# Patient Record
Sex: Male | Born: 1960 | Race: White | Hispanic: No | Smoking: Never smoker
Health system: Southern US, Community
[De-identification: ages and names within clinical notes are randomized; demographics above are authoritative.]

## PROBLEM LIST (undated history)

## (undated) DIAGNOSIS — R7303 Prediabetes: Secondary | ICD-10-CM

## (undated) DIAGNOSIS — E782 Mixed hyperlipidemia: Secondary | ICD-10-CM

## (undated) DIAGNOSIS — I10 Essential (primary) hypertension: Secondary | ICD-10-CM

## (undated) DIAGNOSIS — R002 Palpitations: Secondary | ICD-10-CM

## (undated) DIAGNOSIS — Z01810 Encounter for preprocedural cardiovascular examination: Secondary | ICD-10-CM

## (undated) HISTORY — PX: EYE SURGERY: SHX253

## (undated) HISTORY — DX: Essential (primary) hypertension: I10

## (undated) HISTORY — DX: Mixed hyperlipidemia: E78.2

## (undated) HISTORY — DX: Prediabetes: R73.03

## (undated) HISTORY — PX: TYMPANOSTOMY TUBE PLACEMENT: SHX32

## (undated) HISTORY — DX: Palpitations: R00.2

## (undated) HISTORY — DX: Encounter for preprocedural cardiovascular examination: Z01.810

---
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
## 2012-06-06 ENCOUNTER — Ambulatory Visit: Payer: Self-pay | Admitting: Emergency Medicine

## 2012-06-06 VITALS — BP 115/80 | HR 76 | Temp 97.5°F | Resp 18 | Ht 64.5 in | Wt 144.0 lb

## 2012-06-06 DIAGNOSIS — Z0289 Encounter for other administrative examinations: Secondary | ICD-10-CM

## 2012-08-11 NOTE — Progress Notes (Signed)
  Subjective:    Patient ID: Tyler Santana, male    DOB: 07-29-1961, 51 y.o.   MRN: [id number]  HPI  DOT exam  Review of Systems    As per HPI, otherwise negative.   Objective:   Physical Exam  GEN: WDWN, NAD, Non-toxic, A & O x 3 HEENT: Atraumatic, Normocephalic. Neck supple. No masses, No LAD. Ears and Nose: No external deformity. CV: RRR, No M/G/R. No JVD. No thrill. No extra heart sounds. PULM: CTA B, no wheezes, crackles, rhonchi. No retractions. No resp. distress. No accessory muscle use. ABD: S, NT, ND, +BS. No rebound. No HSM. EXTR: No c/c/e NEURO Normal gait.  PSYCH: Normally interactive. Conversant. Not depressed or anxious appearing.  Calm demeanor.        Assessment & Plan:  DOT exam completed

## 2012-12-19 ENCOUNTER — Ambulatory Visit: Payer: Self-pay | Admitting: Family Medicine

## 2012-12-19 ENCOUNTER — Ambulatory Visit: Payer: Self-pay

## 2012-12-19 VITALS — BP 118/82 | HR 80 | Temp 98.3°F | Resp 16 | Ht 63.5 in | Wt 145.8 lb

## 2012-12-19 DIAGNOSIS — S8992XA Unspecified injury of left lower leg, initial encounter: Secondary | ICD-10-CM

## 2012-12-19 DIAGNOSIS — S99929A Unspecified injury of unspecified foot, initial encounter: Secondary | ICD-10-CM

## 2012-12-19 DIAGNOSIS — S59919A Unspecified injury of unspecified forearm, initial encounter: Secondary | ICD-10-CM

## 2012-12-19 DIAGNOSIS — S6992XA Unspecified injury of left wrist, hand and finger(s), initial encounter: Secondary | ICD-10-CM

## 2012-12-19 DIAGNOSIS — S59909A Unspecified injury of unspecified elbow, initial encounter: Secondary | ICD-10-CM

## 2012-12-19 MED ORDER — OXYCODONE-ACETAMINOPHEN 5-325 MG PO TABS: 1.0000 | ORAL_TABLET | ORAL | Status: DC | PRN

## 2012-12-19 NOTE — Progress Notes (Signed)
Subjective:    Patient ID: Tyler Santana, male    DOB: 1961/01/28, 52 y.o.   MRN: [id number] Chief Complaint  Patient presents with  . Knee Pain    both knees but L knee is worse  . Arm Pain    L arm pain  . Wrist Pain    L wrist  . Fall    fell yesterday while bowling and fell again last nigt down stairs    HPI  He feel forward onto his arms and knees at the bowling alley - was very slick and fell hard - the whole boweling alley heard him. Then this morning he just tripped and fell down the stairs.  Did hit his head a little yesterday after the stair fall but just a bump - didn't hurt it, no headache.  He is very tired more than normal but didn't sleep well last night due to pain.  Main pain is in left knee and left wrist Did take some tylenol about 4-6 hours ago - not touching the pain.  Does have a distant h/o several ortho injuries - patellar fracture, left radial/ulnar fracture - he never followed up with ortho after healing as he was instructed. 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   History reviewed. No pertinent past medical history. No current outpatient prescriptions on file prior to visit.   No current facility-administered medications on file prior to visit.   Allergies  Allergen Reactions  . Codeine Rash     Review of Systems  Constitutional: Negative for fever, chills, diaphoresis, activity change and appetite change.  Musculoskeletal: Positive for myalgias, back pain, joint swelling, arthralgias and gait problem.  Skin: Negative for color change, rash and wound.  Neurological: Positive for weakness and headaches. Negative for dizziness, tremors, seizures, syncope, light-headedness and numbness.  Hematological: Negative for adenopathy. Does not bruise/bleed easily.  Psychiatric/Behavioral: Positive for sleep disturbance.      BP 118/82  Pulse 80  Temp(Src) 98.3 F (36.8 C) (Oral)  Resp 16  Ht 5' 3.5" (1.613 m)  Wt 145 lb 12.8 oz  (66.134 kg)  BMI 25.42 kg/m2  SpO2 97% Objective:   Physical Exam  Constitutional: He appears well-nourished. No distress.  HENT:  Head: Normocephalic and atraumatic.  Eyes: Conjunctivae are normal. Pupils are equal, round, and reactive to light. No scleral icterus.  Neck: Normal range of motion. Neck supple. No thyromegaly present.  Cardiovascular: Normal rate, regular rhythm, normal heart sounds and intact distal pulses.   Pulmonary/Chest: Effort normal and breath sounds normal. No respiratory distress.  Musculoskeletal: He exhibits no edema.       Left wrist: He exhibits decreased range of motion, tenderness and bony tenderness. He exhibits no swelling, no effusion, no crepitus, no deformity and no laceration.       Left knee: He exhibits decreased range of motion and bony tenderness. He exhibits no swelling, no effusion, no ecchymosis, no deformity, no laceration, no erythema and normal patellar mobility. Tenderness found. Medial joint line, MCL and patellar tendon tenderness noted.  Left knee with most tenderness over medial tibial plateau  Lymphadenopathy:    He has no cervical adenopathy.  Neurological: He is alert. He has normal reflexes. He is not disoriented. No cranial nerve deficit.  Skin: Skin is warm and dry. He is not diaphoretic.  Psychiatric: He has a normal mood and affect. His behavior is normal.         [facility] reading (PRIMARY) by  Dr. Clelia Croft. Left wrist: Abnormality of the ulnar styloid?  Possible chronic? - Stat overread - No acute finding. 2. Findings consistent with ulnocarpal abutment.  3. Degenerative disease distal radioulnar joint. Left knee - some mild medial joint space narrowing. No acute bony abnormality seen. Assessment & Plan:  Wrist injury, left, initial encounter - Plan: DG Wrist Complete Left, oxyCODONE-acetaminophen (ROXICET) 5-325 MG per tablet. Suspect acute flair of chronic arthritis and joint degeneration - placed in pre-fab cock-up wrist splint and  sling. Out of work x 4d, then ok to gradually return to normal acitivity. If pain cont or worsens, RTC for further eval - if wrist pain continues - will likely need ortho referral due to the chronic ulnocarpal degeneration and compression.  Knee injuries, left, initial encounter - Plan: DG Knee Complete 4 Views Left.  Suspect injury caused flair of OA vs pes anserine burisitis. If he cont to have pain after sev days of rest, rec RTC to consider steroid injection  Meds ordered this encounter  Medications  . oxyCODONE-acetaminophen (ROXICET) 5-325 MG per tablet    Sig: Take 1 tablet by mouth every 4 (four) hours as needed for pain.    Dispense:  40 tablet    Refill:  0

## 2012-12-19 NOTE — Patient Instructions (Addendum)
Wrist Sprain °with Rehab °A sprain is an injury in which a ligament that maintains the proper alignment of a joint is partially or completely torn. The ligaments of the wrist are susceptible to sprains. Sprains are classified into three categories. Grade 1 sprains cause pain, but the tendon is not lengthened. Grade 2 sprains include a lengthened ligament because the ligament is stretched or partially ruptured. With grade 2 sprains there is still function, although the function may be diminished. Grade 3 sprains are characterized by a complete tear of the tendon or muscle, and function is usually impaired. °SYMPTOMS  °· Pain tenderness, inflammation, and/or bruising (contusion) of the injury. °· A "pop" or tear felt and/or heard at the time of injury. °· Decreased wrist function. °CAUSES  °A wrist sprain occurs when a force is placed on one or more ligaments that is greater than it/they can withstand. Common mechanisms of injury include: °· Catching a ball with you hands. °· Repetitive and/ or strenuous extension or flexion of the wrist. °RISK INCREASES WITH: °· Previous wrist injury. °· Contact sports (boxing or wrestling). °· Activities in which falling is common. °· Poor strength and flexibility. °· Improperly fitted or padded protective equipment. °PREVENTION °· Warm up and stretch properly before activity. °· Allow for adequate recovery between workouts. °· Maintain physical fitness: °· Strength, flexibility, and endurance. °· Cardiovascular fitness. °· Protect the wrist joint by limiting its motion with the use of taping, braces, or splints. °· Protect the wrist after injury for 6 to 12 months. °PROGNOSIS  °The prognosis for wrist sprains depends on the degree of injury. Grade 1 sprains require 2 to 6 weeks of treatment. Grade 2 sprains require 6 to 8 weeks of treatment, and grade 3 sprains require up to 12 weeks.  °RELATED COMPLICATIONS  °· Prolonged healing time, if improperly treated or  re-injured. °· Recurrent symptoms that result in a chronic problem. °· Injury to nearby structures (bone, cartilage, nerves, or tendons). °· Arthritis of the wrist. °· Inability to compete in athletics at a high level. °· Wrist stiffness or weakness. °· Progression to a complete rupture of the ligament. °TREATMENT  °Treatment initially involves resting from any activities that aggravate the symptoms, and the use of ice and medications to help reduce pain and inflammation. Your caregiver may recommend immobilizing the wrist for a period of time in order to reduce stress on the ligament and allow for healing. After immobilization it is important to perform strengthening and stretching exercises to help regain strength and a full range of motion. These exercises may be completed at home or with a therapist. Surgery is not usually required for wrist sprains, unless the ligament has been ruptured (grade 3 sprain). °MEDICATION  °· If pain medication is necessary, then nonsteroidal anti-inflammatory medications, such as aspirin and ibuprofen, or other minor pain relievers, such as acetaminophen, are often recommended. °· Do not take pain medication for 7 days before surgery. °· Prescription pain relievers may be given if deemed necessary by your caregiver. Use only as directed and only as much as you need. °HEAT AND COLD °· Cold treatment (icing) relieves pain and reduces inflammation. Cold treatment should be applied for 10 to 15 minutes every 2 to 3 hours for inflammation and pain and immediately after any activity that aggravates your symptoms. Use ice packs or massage the area with a piece of ice (ice massage). °· Heat treatment may be used prior to performing the stretching and strengthening activities prescribed by your   caregiver, physical therapist, or athletic trainer. Use a heat pack or soak your injury in warm water. °SEEK MEDICAL CARE IF: °· Treatment seems to offer no benefit, or the condition worsens. °· Any  medications produce adverse side effects. °EXERCISES °RANGE OF MOTION (ROM) AND STRETCHING EXERCISES - Wrist Sprain  °These exercises may help you when beginning to rehabilitate your injury. Your symptoms may resolve with or without further involvement from your physician, physical therapist or athletic trainer. While completing these exercises, remember:  °· Restoring tissue flexibility helps normal motion to return to the joints. This allows healthier, less painful movement and activity. °· An effective stretch should be held for at least 30 seconds. °· A stretch should never be painful. You should only feel a gentle lengthening or release in the stretched tissue. °RANGE OF MOTION  Wrist Flexion, Active-Assisted °· Extend your right / left elbow with your fingers pointing down.* °· Gently pull the back of your hand towards you until you feel a gentle stretch on the top of your forearm. °· Hold this position for __________ seconds. °Repeat __________ times. Complete this exercise __________ times per day.  °*If directed by your physician, physical therapist or athletic trainer, complete this stretch with your elbow bent rather than extended. °RANGE OF MOTION  Wrist Extension, Active-Assisted °· Extend your right / left elbow and turn your palm upwards.* °· Gently pull your palm/fingertips back so your wrist extends and your fingers point more toward the ground. °· You should feel a gentle stretch on the inside of your forearm. °· Hold this position for __________ seconds. °Repeat __________ times. Complete this exercise __________ times per day. °*If directed by your physician, physical therapist or athletic trainer, complete this stretch with your elbow bent, rather than extended. °RANGE OF MOTION  Supination, Active °· Stand or sit with your elbows at your side. Bend your right / left elbow to 90 degrees. °· Turn your palm upward until you feel a gentle stretch on the inside of your forearm. °· Hold this position  for __________ seconds. Slowly release and return to the starting position. °Repeat __________ times. Complete this stretch __________ times per day.  °RANGE OF MOTION  Pronation, Active °· Stand or sit with your elbows at your side. Bend your right / left elbow to 90 degrees. °· Turn your palm downward until you feel a gentle stretch on the top of your forearm. °· Hold this position for __________ seconds. Slowly release and return to the starting position. °Repeat __________ times. Complete this stretch __________ times per day.  °STRETCH - Wrist Flexion °· Place the back of your right / left hand on a tabletop leaving your elbow slightly bent. Your fingers should point away from your body. °· Gently press the back of your hand down onto the table by straightening your elbow. You should feel a stretch on the top of your forearm. °· Hold this position for __________ seconds. °Repeat __________ times. Complete this stretch __________ times per day.  °STRETCH  Wrist Extension °· Place your right / left fingertips on a tabletop leaving your elbow slightly bent. Your fingers should point backwards. °· Gently press your fingers and palm down onto the table by straightening your elbow. You should feel a stretch on the inside of your forearm. °· Hold this position for __________ seconds. °Repeat __________ times. Complete this stretch __________ times per day.  °STRENGTHENING EXERCISES - Wrist Sprain °These exercises may help you when beginning to rehabilitate your injury.   They may resolve your symptoms with or without further involvement from your physician, physical therapist or athletic trainer. While completing these exercises, remember:  °· Muscles can gain both the endurance and the strength needed for everyday activities through controlled exercises. °· Complete these exercises as instructed by your physician, physical therapist or athletic trainer. Progress with the resistance and repetition exercises only as your  caregiver advises. °STRENGTH Wrist Flexors °· Sit with your right / left forearm palm-up and fully supported. Your elbow should be resting below the height of your shoulder. Allow your wrist to extend over the edge of the surface. °· Loosely holding a __________ weight or a piece of rubber exercise band/tubing, slowly curl your hand up toward your forearm. °· Hold this position for __________ seconds. Slowly lower the wrist back to the starting position in a controlled manner. °Repeat __________ times. Complete this exercise __________ times per day.  °STRENGTH  Wrist Extensors °· Sit with your right / left forearm palm-down and fully supported. Your elbow should be resting below the height of your shoulder. Allow your wrist to extend over the edge of the surface. °· Loosely holding a __________ weight or a piece of rubber exercise band/tubing, slowly curl your hand up toward your forearm. °· Hold this position for __________ seconds. Slowly lower the wrist back to the starting position in a controlled manner. °Repeat __________ times. Complete this exercise __________ times per day.  °STRENGTH - Ulnar Deviators °· Stand with a ____________________ weight in your right / left hand, or sit holding on to the rubber exercise band/tubing with your opposite arm supported. °· Move your wrist so that your pinkie travels toward your forearm and your thumb moves away from your forearm. °· Hold this position for __________ seconds and then slowly lower the wrist back to the starting position. °Repeat __________ times. Complete this exercise __________ times per day °STRENGTH - Radial Deviators °· Stand with a ____________________ weight in your °· right / left hand, or sit holding on to the rubber exercise band/tubing with your arm supported. °· Raise your hand upward in front of you or pull up on the rubber tubing. °· Hold this position for __________ seconds and then slowly lower the wrist back to the starting  position. °Repeat __________ times. Complete this exercise __________ times per day. °STRENGTH  Forearm Supinators °· Sit with your right / left forearm supported on a table, keeping your elbow below shoulder height. Rest your hand over the edge, palm down. °· Gently grip a hammer or a soup ladle. °· Without moving your elbow, slowly turn your palm and hand upward to a "thumbs-up" position. °· Hold this position for __________ seconds. Slowly return to the starting position. °Repeat __________ times. Complete this exercise __________ times per day.  °STRENGTH  Forearm Pronators °· Sit with your right / left forearm supported on a table, keeping your elbow below shoulder height. Rest your hand over the edge, palm up. °· Gently grip a hammer or a soup ladle. °· Without moving your elbow, slowly turn your palm and hand upward to a "thumbs-up" position. °· Hold this position for __________ seconds. Slowly return to the starting position. °Repeat __________ times. Complete this exercise __________ times per day.  °STRENGTH - Grip °· Grasp a tennis ball, a dense sponge, or a large, rolled sock in your hand. °· Squeeze as hard as you can without increasing any pain. °· Hold this position for __________ seconds. Release your grip slowly. °Repeat   __________ times. Complete this exercise __________ times per day.  Document Released: 10/05/2005 Document Revised: 12/28/2011 Document Reviewed: 01/17/2009 [facility] Patient Information 2013 [place], [place].   Pes Anserinus Syndrome with Rehab The pes anserine, also known as the goose's foot, is an area of the shinbone (tibia) near the knee joint where the tendons of three of the muscles of the thigh insert into the bone. These muscles are important for bending the knee and bringing the leg across the body. Just underneath the three tendons that attach at the pes anserinus exists a fluid filled sac (bursa) that is meant to reduce the friction between the tendons and the  tibia. Pes anserinus syndrome is a condition that is characterized by inflammation of the bursa (bursitis) and/ or tendonitis (inflammation of the tendon) and may cause severe pain in the lower portion of the inner (medial) side of the knee. SYMPTOMS   Pain and inflammation over the lower portion of the medial side of the knee.  Pain that worsens as the duration of an activity increases.  Pain that worsens when bending the knee, especially against resistance.  A crackling sound (crepitation) when the tendon or bursa is moved or touched. CAUSES  Bursitis and tendonitis are usually characterized as overuse injuries. Common mechanisms of injury include:  Stress placed on the knee from a sudden increase in the intensity, frequency, or duration of training.  Direct trauma to the upper leg (less common). RISK INCREASES WITH:  Endurance sports (distance running or triathletes).  Making changes to or beginning a new training program.  Sports that place stress on the muscles that insert at the pes anserinus, such as those that require pivoting, cutting, or jumping.  Improper training.  Poor strength and flexibility  Failure to warm-up properly before activity.  Improper knee alignment ( knock knees).  Arthritis of the knee. PREVENTION  Warm up and stretch properly before activity.  Allow for adequate recovery between workouts.  Maintain physical fitness:  Strength, flexibility, and endurance.  Cardiovascular fitness.  Learn and use training methods that will reduce the stress placed on the pes anserinus.  Arch supports (orthotics) may be helpful for those with flat feet. PROGNOSIS  If treated properly, then the symptoms of pes anserinus syndrome usually resolve within 6 weeks.  RELATED COMPLICATIONS   Persistent and potentially chronic pain if the condition is not treated properly.  Re-injury if activity is resumed before the injury is allowed to heal completely, or if  one resumes improper training habits. TREATMENT Treatment initially involves the use of ice and medication to help reduce pain and inflammation. The use of strengthening and stretching exercises may help reduce pain with activity. These exercises may be performed at home or with a therapist. Individuals who have flat feet may find benefit in wearing arch supports in their shoes. Some individuals find that compression bandages or knee sleeves help reduce symptoms. Your caregiver may recommend a corticosteroid injection to help reduce inflammation. If symptoms persist, despite conservative treatment for greater than 6 months, then surgery may be recommended.  MEDICATION   If pain medication is necessary, then nonsteroidal anti-inflammatory medications, such as aspirin and ibuprofen, or other minor pain relievers, such as acetaminophen, are often recommended.  Do not take pain medication for 7 days before surgery.  Prescription pain relievers may be given if deemed necessary by your caregiver. Use only as directed and only as much as you need.  Corticosteroid injections may be given by your caregiver. These injections should  be reserved for the most serious cases, because they may only be given a certain number of times. SEEK MEDICAL CARE IF:  Treatment seems to offer no benefit, or the condition worsens.  Any medications produce adverse side effects. EXERCISES  RANGE OF MOTION (ROM) AND STRETCHING EXERCISES - Pes Anserinus Syndrome These exercises may help you when beginning to rehabilitate your injury. Your symptoms may resolve with or without further involvement from your physician, physical therapist or athletic trainer. While completing these exercises, remember:   Restoring tissue flexibility helps normal motion to return to the joints. This allows healthier, less painful movement and activity.  An effective stretch should be held for at least 30 seconds.  A stretch should never be  painful. You should only feel a gentle lengthening or release in the stretched tissue. STRETCH  Hamstrings, Supine  Lie on your back. Loop a belt or towel over the ball of your right / left foot.  Straighten your right / left knee and slowly pull on the belt to raise your leg. Do not allow the right / left knee to bend. Keep your opposite leg flat on the floor.  Raise the leg until you feel a gentle stretch behind your right / left knee or thigh. Hold this position for __________ seconds. Repeat __________ times. Complete this stretch __________ times per day.  STRETCH - Hamstrings, Doorway  Lie on your back with your right / left leg extended and resting on the wall and the opposite leg flat on the ground through the door. Initially, position your bottom farther away from the wall than the illustration shows.  Keep your right / left knee straight. If you feel a stretch behind your knee or thigh, hold this position for __________ seconds.  If you do not feel a stretch, scoot your bottom closer to the door, and hold __________ seconds. Repeat __________ times. Complete this stretch __________ times per day.  STRETCH - Hamstrings/Adductors, V-Sit  Sit on the floor with your legs extended in a large "V," keeping your knees straight.  With your head and chest upright, bend at your waist reaching for your right foot to stretch your left adductors.  You should feel a stretch in your left inner thigh. Hold for __________ seconds.  Return to the upright position to relax your leg muscles.  Continuing to keep your chest upright, bend straight forward at your waist to stretch your hamstrings.  You should feel a stretch behind both of your thighs and/or knees. Hold for __________ seconds.  Return to the upright position to relax your leg muscles.  Repeat steps 2 through 4. Repeat __________ times. Complete this exercise __________ times per day.  STRETCH - Hamstrings, Standing  Stand or sit  and extend your right / left leg, placing your foot on a chair or foot stool  Keeping a slight arch in your low back and your hips straight forward.  Lead with your chest and lean forward at the waist until you feel a gentle stretch in the back of your right / left knee or thigh. (When done correctly, this exercise requires leaning only a small distance.)  Hold this position for __________ seconds. Repeat __________ times. Complete this stretch __________ times per day. STRETCH - Adductors, Lunge  While standing, spread your legs  Lean away from your right / left leg by bending your opposite knee. You may rest your hands on your thigh for balance.  You should feel a stretch in your right /  left inner thigh. Hold for __________ seconds. Repeat __________ times. Complete this exercise __________ times per day.  STRETCH - Adductors, Standing  Place your right / left foot on a counter or stable table. Turn away from your leg so both hips line up with your right / left leg.  Keeping your hips facing forward, slowly bend your opposite leg until you feel a gentle stretch on the inside of your right / left thigh.  Hold for __________ seconds. Repeat __________ times. Complete this exercise __________ times per day.  STRENGTHENING EXERCISES - Pes Anserinus Syndrome  These exercises may help you when beginning to rehabilitate your injury. They may resolve your symptoms with or without further involvement from your physician, physical therapist or athletic trainer. While completing these exercises, remember:   Muscles can gain both the endurance and the strength needed for everyday activities through controlled exercises.  Complete these exercises as instructed by your physician, physical therapist or athletic trainer. Progress the resistance and repetitions only as guided. STRENGTH - Hamstring, Curls  Lay on your stomach with your legs extended. (If you lay on a bed, your feet may hang over the  edge.)  Tighten the muscles in the back of your thigh to bend your right / left knee up to 90 degrees. Keep your hips flat on the bed/floor.  Hold this position for __________ seconds.  Slowly lower your leg back to the starting position. Repeat __________ times. Complete this exercise __________ times per day.  OPTIONAL ANKLE WEIGHTS: Begin with ____________________, but DO NOT exceed ____________________. Increase in 1 lb/0.5 kg increments.  STRENGTH - Hip Adductors, Straight Leg Raises  Lie on your side so that your head, shoulders, knee and hip line up. You may place your upper foot in front to help maintain your balance. Your right / left leg should be on the bottom.  Roll your hips slightly forward, so that your hips are stacked directly over each other and your right / left knee is facing forward.  Tense the muscles in your inner thigh and lift your bottom leg 4-6 inches. Hold this position for __________ seconds.  Slowly lower your leg to the starting position. Allow the muscles to fully relax before beginning the next repetition. Repeat __________ times. Complete this exercise __________ times per day.  Document Released: 10/05/2005 Document Revised: 12/28/2011 Document Reviewed: 01/17/2009 [facility] Patient Information 2013 [place], [place].

## 2013-04-07 ENCOUNTER — Telehealth: Payer: Self-pay

## 2013-04-07 NOTE — Telephone Encounter (Signed)
Pt would like a copy of his [employer] long form which was done 06/06/2012, pt states he needs this asap. Fax to: [phone] Best# [phone]

## 2013-04-07 NOTE — Telephone Encounter (Signed)
Received documentation with confirmation and stapled to paper chart.

## 2013-04-07 NOTE — Telephone Encounter (Signed)
Faxed to that number. Just waiting on a confirmation from the fax machine.

## 2013-04-12 ENCOUNTER — Telehealth: Payer: Self-pay

## 2013-04-12 NOTE — Telephone Encounter (Signed)
Patient would like for his [employer] long form to be faxed to [phone].  He will be leaving the office at 3 today and needs to have it faxed by then if possible.  The patient's chart shows that the form was faxed on June 20.  I verified the fax number, and the fax number listed in his chart was different than what he gave me today.

## 2013-04-13 NOTE — Telephone Encounter (Signed)
In patient's paper chart someone has sent it to that fax number with a confirmation yesterday.

## 2013-12-08 IMAGING — CR DG WRIST COMPLETE 3+V*L*
2 series · 2 of 2 positions shown · non-contrast
Comparison: None.

CLINICAL DATA: Fall, pain.

LEFT WRIST - COMPLETE 3+ VIEW

[PA]
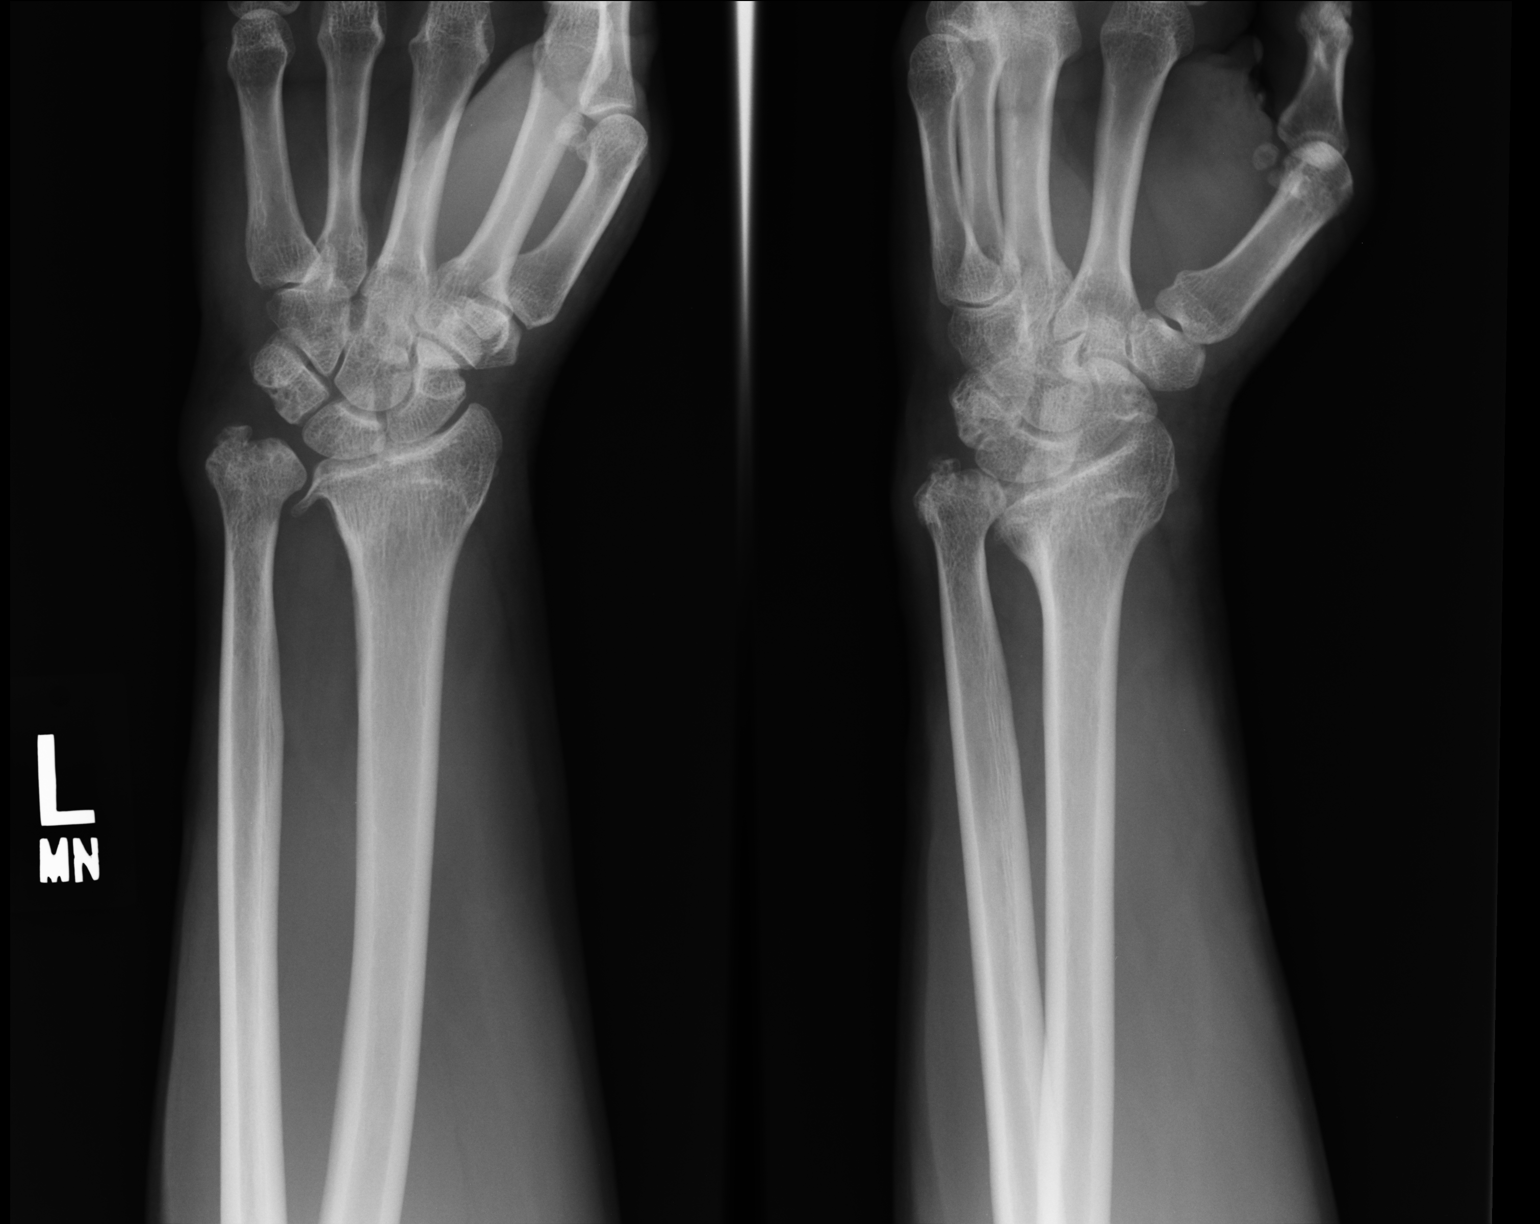

[lateral]
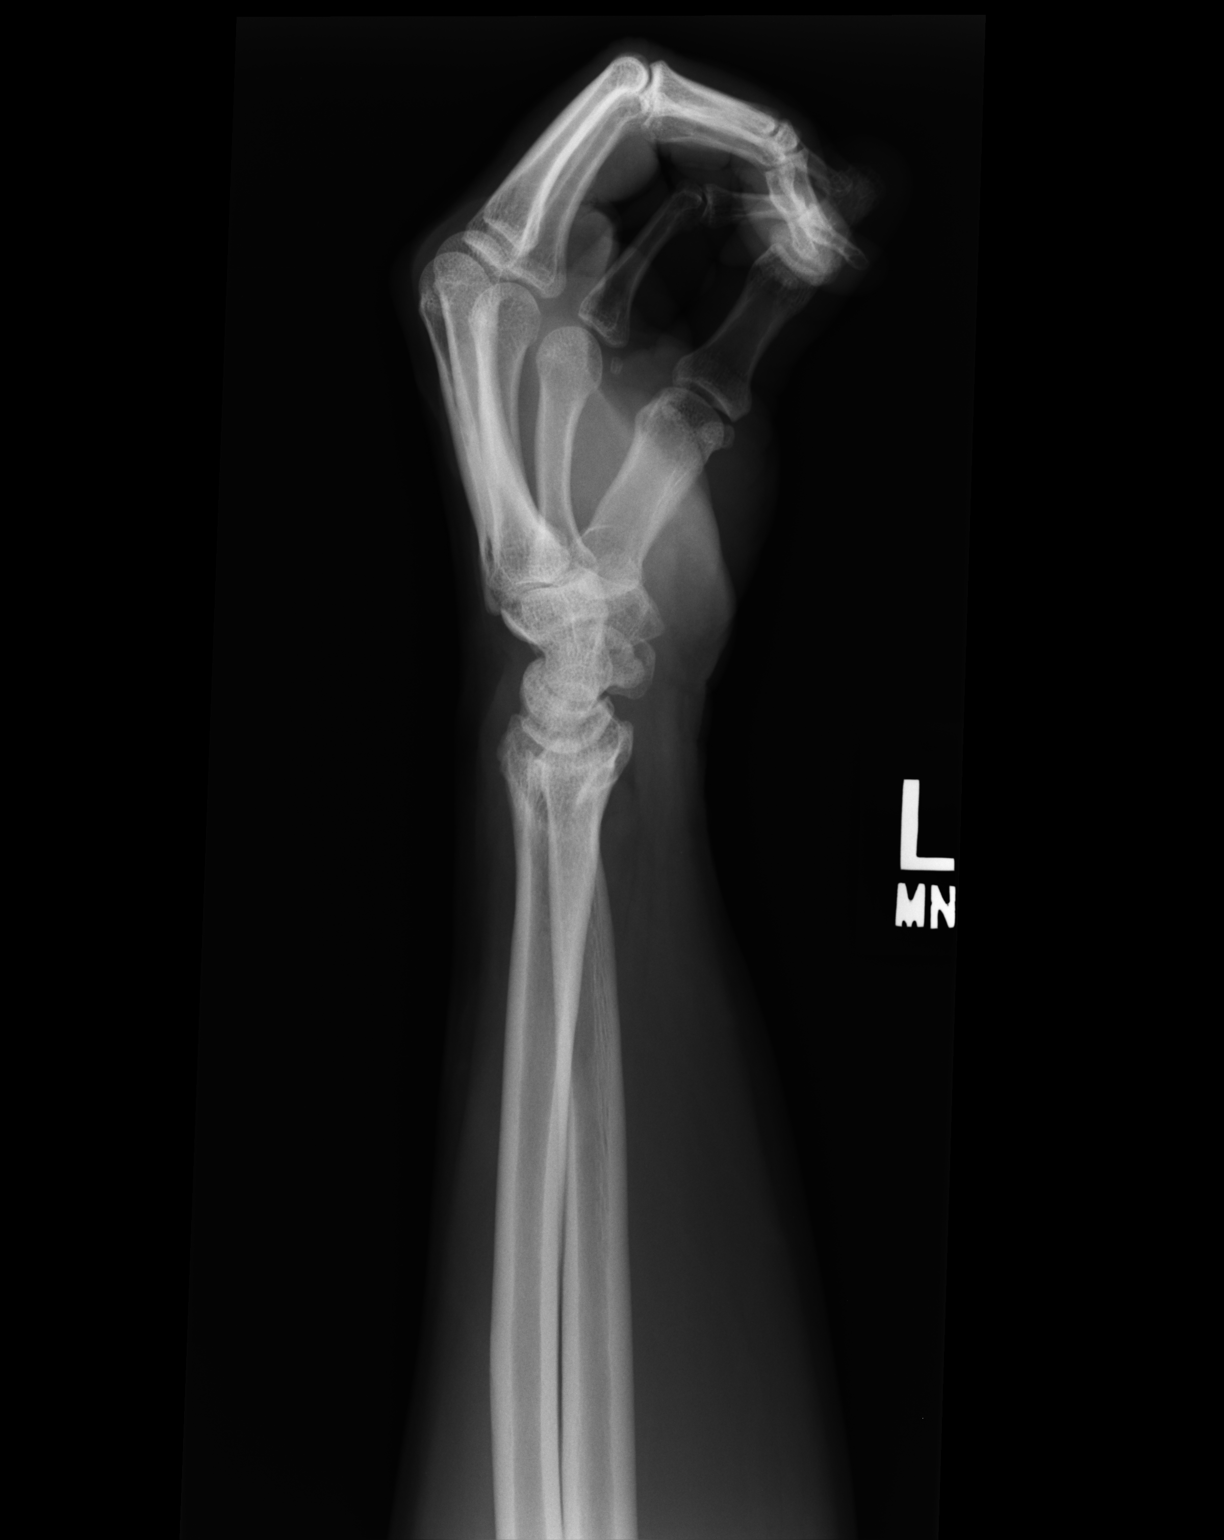

[2 of 2 positions shown; findings below may reference images not displayed]

FINDINGS: There is no acute bony or joint abnormality.  The patient
has marked ulnar positive variance.  There is cystic change in both
the triquetrum, lunate and distal ulna consistent with ulnocarpal
abutment.  Soft tissue structures are unremarkable.  Osteophyte off
the distal radius at the distal radioulnar joint is noted.
IMPRESSION: 1.  No acute finding.
2.  Findings consistent with ulnocarpal abutment.
3.  Degenerative disease distal radioulnar joint.

## 2014-06-12 DIAGNOSIS — S0093XA Contusion of unspecified part of head, initial encounter: Secondary | ICD-10-CM

## 2014-06-12 DIAGNOSIS — S5010XA Contusion of unspecified forearm, initial encounter: Secondary | ICD-10-CM | POA: Insufficient documentation

## 2014-06-12 DIAGNOSIS — S0181XA Laceration without foreign body of other part of head, initial encounter: Secondary | ICD-10-CM

## 2014-06-12 HISTORY — DX: Contusion of unspecified part of head, initial encounter: S00.93XA

## 2014-06-12 HISTORY — DX: Contusion of unspecified forearm, initial encounter: S50.10XA

## 2014-06-12 HISTORY — DX: Laceration without foreign body of other part of head, initial encounter: S01.81XA

## 2014-07-13 DIAGNOSIS — K4090 Unilateral inguinal hernia, without obstruction or gangrene, not specified as recurrent: Secondary | ICD-10-CM | POA: Insufficient documentation

## 2014-07-13 HISTORY — DX: Unilateral inguinal hernia, without obstruction or gangrene, not specified as recurrent: K40.90

## 2014-09-21 DIAGNOSIS — K4090 Unilateral inguinal hernia, without obstruction or gangrene, not specified as recurrent: Secondary | ICD-10-CM

## 2014-09-21 HISTORY — DX: Unilateral inguinal hernia, without obstruction or gangrene, not specified as recurrent: K40.90

## 2015-02-15 DIAGNOSIS — Z024 Encounter for examination for driving license: Secondary | ICD-10-CM | POA: Insufficient documentation

## 2015-02-15 HISTORY — DX: Encounter for examination for driving license: Z02.4

## 2020-06-26 DIAGNOSIS — R079 Chest pain, unspecified: Secondary | ICD-10-CM

## 2020-06-26 DIAGNOSIS — R0600 Dyspnea, unspecified: Secondary | ICD-10-CM | POA: Insufficient documentation

## 2020-06-26 HISTORY — DX: Chest pain, unspecified: R07.9

## 2020-06-26 HISTORY — DX: Dyspnea, unspecified: R06.00

## 2021-06-24 ENCOUNTER — Telehealth: Payer: Self-pay

## 2021-06-24 NOTE — Telephone Encounter (Signed)
Referral notes received from [facility], Phone #: [phone], Fax #: [phone]   A copy of the referral have been placed in the scheduling box for check-out to pick-up and to enter referral. Original notes placed in file cabinet.

## 2021-08-05 ENCOUNTER — Other Ambulatory Visit: Payer: Self-pay

## 2021-08-05 DIAGNOSIS — R7303 Prediabetes: Secondary | ICD-10-CM | POA: Insufficient documentation

## 2021-08-05 DIAGNOSIS — R002 Palpitations: Secondary | ICD-10-CM | POA: Insufficient documentation

## 2021-08-05 DIAGNOSIS — E782 Mixed hyperlipidemia: Secondary | ICD-10-CM | POA: Insufficient documentation

## 2021-08-05 DIAGNOSIS — I10 Essential (primary) hypertension: Secondary | ICD-10-CM | POA: Insufficient documentation

## 2021-08-19 ENCOUNTER — Encounter: Payer: Self-pay | Admitting: Cardiology

## 2021-08-19 ENCOUNTER — Ambulatory Visit (INDEPENDENT_AMBULATORY_CARE_PROVIDER_SITE_OTHER): Payer: 59 | Admitting: Cardiology

## 2021-08-19 ENCOUNTER — Other Ambulatory Visit: Payer: Self-pay

## 2021-08-19 VITALS — BP 110/76 | HR 69 | Ht 65.6 in | Wt 148.1 lb

## 2021-08-19 DIAGNOSIS — E782 Mixed hyperlipidemia: Secondary | ICD-10-CM | POA: Diagnosis not present

## 2021-08-19 DIAGNOSIS — R079 Chest pain, unspecified: Secondary | ICD-10-CM | POA: Insufficient documentation

## 2021-08-19 DIAGNOSIS — I10 Essential (primary) hypertension: Secondary | ICD-10-CM | POA: Diagnosis not present

## 2021-08-19 DIAGNOSIS — R011 Cardiac murmur, unspecified: Secondary | ICD-10-CM

## 2021-08-19 HISTORY — DX: Cardiac murmur, unspecified: R01.1

## 2021-08-19 HISTORY — DX: Chest pain, unspecified: R07.9

## 2021-08-19 MED ORDER — NITROGLYCERIN 0.4 MG SL SUBL: 0.4000 mg | SUBLINGUAL_TABLET | SUBLINGUAL | 6 refills | Status: DC | PRN

## 2021-08-19 NOTE — Progress Notes (Signed)
Cardiology Office Note:    Date:  08/19/2021   ID:  Tyler Santana, DOB 1961/04/02, MRN [id number]  PCP:  Jackie Plum, MD  Cardiologist:  Garwin Brothers, MD   Referring MD: Jackie Plum, MD    ASSESSMENT:    1. Essential hypertension   2. Chest pain of uncertain etiology   3. Mixed hyperlipidemia   4. Cardiac murmur    PLAN:    In order of problems listed above:  Primary prevention stressed with the patient.  Importance of compliance with diet medication stressed and vocalized understanding. Chest tightness on exertion: I discussed my findings with the patient at length.  He has risk factors for coronary artery disease and so we will do an exercise stress Cardiolite.  He knows to go to the nearest emergency room for any concerning symptoms.  Sublingual nitroglycerin prescription was sent, its protocol and 911 protocol explained and the patient vocalized understanding questions were answered to the patient's satisfaction Mixed dyslipidemia: Lipids are markedly elevated and diet was emphasized.  I will discuss this once we settle down with the above symptoms and his evaluation. Cardiac murmur: Echocardiogram will be done to assess murmur on auscultation. Essential hypertension: Stable with lifestyle modification and he is not on any significant medications. Patient will be seen in follow-up appointment in 2 months or earlier if the patient has any concerns    Medication Adjustments/Labs and Tests Ordered: Current medicines are reviewed at length with the patient today.  Concerns regarding medicines are outlined above.  No orders of the defined types were placed in this encounter.  No orders of the defined types were placed in this encounter.    History of Present Illness:    Tyler Santana is a 60 y.o. male who is being seen today for the evaluation of chest pain at the request of Osei-Bonsu, Greggory Stallion, MD. patient is a pleasant 60 year old male.  He has past medical  history of mixed dyslipidemia and essential hypertension.  Patient mentions to me that over the past several months he is noticing shortness of breath on exertion.  No chest pain orthopnea or PND.  He has some chest tightness issues not related to exertion no radiation to the neck or to the arm.  He is concerned about it.  I was more concerned about her shortness of breath on exertion.  Therefore he is here for evaluation.  At the time of my evaluation, the patient is alert awake oriented and in no distress.  Past Medical History:  Diagnosis Date   Chest pain 06/26/2020   Dyspnea 06/26/2020   Encounter for Department of Transportation (DOT) examination for driving license renewal 2/77/4128   Essential hypertension    Forearm contusion 06/12/2014   Head contusion 06/12/2014   Laceration of forehead without complication 06/12/2014   Mixed hyperlipidemia    Palpitation    Prediabetes    Right inguinal hernia 07/13/2014    Past Surgical History:  Procedure Laterality Date   CLEFT PALATE REPAIR  1966   EYE SURGERY  1967, 7867,6720   both eyes   TYMPANOSTOMY TUBE PLACEMENT      Current Medications: Current Meds  Medication Sig   metoprolol succinate (TOPROL-XL) 25 MG 24 hr tablet Take 25 mg by mouth daily.     Allergies:   Codeine   Social History   Socioeconomic History   Marital status: Widowed    Spouse name: Not on file   Number of children: Not on file   Years  of education: Not on file   Highest education level: Not on file  Occupational History   Not on file  Tobacco Use   Smoking status: Never   Smokeless tobacco: Never  Substance and Sexual Activity   Alcohol use: Not on file   Drug use: Not on file   Sexual activity: Not on file  Other Topics Concern   Not on file  Social History Narrative   Not on file   Social Determinants of Health   Financial Resource Strain: Not on file  Food Insecurity: Not on file  Transportation Needs: Not on file  Physical Activity: Not  on file  Stress: Not on file  Social Connections: Not on file     Family History: The patient's family history includes Cancer in his mother; Diabetes in his maternal grandmother; Heart attack in his mother; Heart disease in his mother; Hypertension in his mother.  ROS:   Please see the history of present illness.    All other systems reviewed and are negative.  EKGs/Labs/Other Studies Reviewed:    The following studies were reviewed today: EKG reveals sinus rhythm and nonspecific ST changes   Recent Labs: No results found for requested labs within last 8760 hours.  Recent Lipid Panel No results found for: CHOL, TRIG, HDL, CHOLHDL, VLDL, LDLCALC, LDLDIRECT  Physical Exam:    VS:  BP 110/76   Pulse 69   Ht 5' 5.6" (1.666 m)   Wt 148 lb 1.9 oz (67.2 kg)   BMI 24.20 kg/m     Wt Readings from Last 3 Encounters:  08/19/21 148 lb 1.9 oz (67.2 kg)  12/19/12 145 lb 12.8 oz (66.1 kg)  06/06/12 144 lb (65.3 kg)     GEN: Patient is in no acute distress HEENT: Normal NECK: No JVD; No carotid bruits LYMPHATICS: No lymphadenopathy CARDIAC: S1 S2 regular, 2/6 systolic murmur at the apex. RESPIRATORY:  Clear to auscultation without rales, wheezing or rhonchi  ABDOMEN: Soft, non-tender, non-distended MUSCULOSKELETAL:  No edema; No deformity  SKIN: Warm and dry NEUROLOGIC:  Alert and oriented x 3 PSYCHIATRIC:  Normal affect    Signed, Garwin Brothers, MD  08/19/2021 3:07 PM    [facility]

## 2021-08-19 NOTE — Patient Instructions (Signed)

## 2021-08-20 ENCOUNTER — Telehealth (HOSPITAL_COMMUNITY): Payer: Self-pay | Admitting: *Deleted

## 2021-08-20 NOTE — Telephone Encounter (Signed)
Left message on voicemail per DPR in reference to upcoming appointment scheduled on 08/26/21 at 8:15 with detailed instructions given per Myocardial Perfusion Study Information Sheet for the test. LM to arrive 15 minutes early, and that it is imperative to arrive on time for appointment to keep from having the test rescheduled. If you need to cancel or reschedule your appointment, please call the office within 24 hours of your appointment. Failure to do so may result in a cancellation of your appointment, and a $50 no show fee. Phone number given for call back for any questions.

## 2021-08-26 ENCOUNTER — Ambulatory Visit (INDEPENDENT_AMBULATORY_CARE_PROVIDER_SITE_OTHER): Payer: 59

## 2021-08-26 ENCOUNTER — Other Ambulatory Visit: Payer: Self-pay

## 2021-08-26 DIAGNOSIS — R079 Chest pain, unspecified: Secondary | ICD-10-CM

## 2021-08-26 LAB — MYOCARDIAL PERFUSION IMAGING
LV dias vol: 51 mL (ref 62–150)
LV sys vol: 18 mL
Nuc Stress EF: 65 %
Peak HR: 112 {beats}/min
Rest HR: 61 {beats}/min
Rest Nuclear Isotope Dose: 8.4 mCi
SDS: 5
SRS: 3
SSS: 8
Stress Nuclear Isotope Dose: 25.7 mCi
TID: 1.5

## 2021-08-26 MED ORDER — TECHNETIUM TC 99M TETROFOSMIN IV KIT
25.7000 | PACK | Freq: Once | INTRAVENOUS | Status: AC | PRN
  Administered 2021-08-26: 25.7 via INTRAVENOUS

## 2021-08-26 MED ORDER — REGADENOSON 0.4 MG/5ML IV SOLN
0.4000 mg | Freq: Once | INTRAVENOUS | Status: AC
  Administered 2021-08-26: 0.4 mg via INTRAVENOUS

## 2021-08-26 MED ORDER — TECHNETIUM TC 99M TETROFOSMIN IV KIT
8.4000 | PACK | Freq: Once | INTRAVENOUS | Status: AC | PRN
  Administered 2021-08-26: 8.4 via INTRAVENOUS

## 2021-09-02 ENCOUNTER — Ambulatory Visit (INDEPENDENT_AMBULATORY_CARE_PROVIDER_SITE_OTHER): Payer: 59

## 2021-09-02 ENCOUNTER — Other Ambulatory Visit: Payer: Self-pay

## 2021-09-02 DIAGNOSIS — R011 Cardiac murmur, unspecified: Secondary | ICD-10-CM | POA: Diagnosis not present

## 2021-09-02 LAB — ECHOCARDIOGRAM COMPLETE
Area-P 1/2: 3.33 cm2
S' Lateral: 2.5 cm

## 2021-10-22 ENCOUNTER — Other Ambulatory Visit: Payer: Self-pay

## 2021-10-28 ENCOUNTER — Ambulatory Visit: Payer: 59 | Admitting: Cardiology

## 2021-12-08 DIAGNOSIS — Z136 Encounter for screening for cardiovascular disorders: Secondary | ICD-10-CM | POA: Diagnosis not present

## 2021-12-08 DIAGNOSIS — Z131 Encounter for screening for diabetes mellitus: Secondary | ICD-10-CM | POA: Diagnosis not present

## 2021-12-08 DIAGNOSIS — Z0001 Encounter for general adult medical examination with abnormal findings: Secondary | ICD-10-CM | POA: Diagnosis not present

## 2021-12-08 DIAGNOSIS — Z1329 Encounter for screening for other suspected endocrine disorder: Secondary | ICD-10-CM | POA: Diagnosis not present

## 2021-12-08 DIAGNOSIS — Z125 Encounter for screening for malignant neoplasm of prostate: Secondary | ICD-10-CM | POA: Diagnosis not present

## 2021-12-08 DIAGNOSIS — R051 Acute cough: Secondary | ICD-10-CM | POA: Diagnosis not present

## 2022-01-11 DIAGNOSIS — N3289 Other specified disorders of bladder: Secondary | ICD-10-CM | POA: Diagnosis not present

## 2022-01-11 DIAGNOSIS — R1084 Generalized abdominal pain: Secondary | ICD-10-CM | POA: Diagnosis not present

## 2022-01-11 DIAGNOSIS — R110 Nausea: Secondary | ICD-10-CM | POA: Diagnosis not present

## 2022-01-11 DIAGNOSIS — N201 Calculus of ureter: Secondary | ICD-10-CM | POA: Diagnosis not present

## 2022-01-11 DIAGNOSIS — N2889 Other specified disorders of kidney and ureter: Secondary | ICD-10-CM | POA: Diagnosis not present

## 2022-01-11 DIAGNOSIS — R1111 Vomiting without nausea: Secondary | ICD-10-CM | POA: Diagnosis not present

## 2022-01-11 DIAGNOSIS — K2970 Gastritis, unspecified, without bleeding: Secondary | ICD-10-CM | POA: Diagnosis not present

## 2022-01-11 DIAGNOSIS — I10 Essential (primary) hypertension: Secondary | ICD-10-CM | POA: Diagnosis not present

## 2022-01-12 DIAGNOSIS — R109 Unspecified abdominal pain: Secondary | ICD-10-CM | POA: Diagnosis not present

## 2022-01-26 ENCOUNTER — Encounter: Payer: Self-pay | Admitting: Cardiology

## 2022-01-26 ENCOUNTER — Ambulatory Visit: Payer: 59 | Admitting: Cardiology

## 2022-01-26 VITALS — BP 120/68 | HR 76 | Ht 65.0 in | Wt 149.1 lb

## 2022-01-26 DIAGNOSIS — I10 Essential (primary) hypertension: Secondary | ICD-10-CM | POA: Diagnosis not present

## 2022-01-26 DIAGNOSIS — N289 Disorder of kidney and ureter, unspecified: Secondary | ICD-10-CM

## 2022-01-26 DIAGNOSIS — E782 Mixed hyperlipidemia: Secondary | ICD-10-CM | POA: Diagnosis not present

## 2022-01-26 HISTORY — DX: Disorder of kidney and ureter, unspecified: N28.9

## 2022-01-26 MED ORDER — METOPROLOL SUCCINATE ER 25 MG PO TB24: 25.0000 mg | ORAL_TABLET | Freq: Every day | ORAL | 3 refills | Status: DC

## 2022-01-26 MED ORDER — NITROGLYCERIN 0.4 MG SL SUBL: 0.4000 mg | SUBLINGUAL_TABLET | SUBLINGUAL | 6 refills | Status: DC | PRN

## 2022-01-26 NOTE — Patient Instructions (Signed)

## 2022-01-26 NOTE — Progress Notes (Signed)
?Cardiology Office Note:   ? ?Date:  01/26/2022  ? ?ID:  Tyler Santana, DOB [birth year], MRN [id number] ? ?PCP:  Benito Mccreedy, MD  ?Cardiologist:  Jenean Lindau, MD  ? ?Referring MD: Benito Mccreedy, MD  ? ? ?ASSESSMENT:   ? ?1. Essential hypertension   ?2. Mixed hyperlipidemia   ?3. Renal insufficiency   ? ?PLAN:   ? ?In order of problems listed above: ? ?Primary prevention stressed with patient.  Importance of compliance with diet and medication stressed and he vocalized understanding. ?He was advised to walk at least half an hour a day 5 days a week and he promises to do so. ?Essential hypertension: Blood pressure stable and diet was emphasized.  Lifestyle modification urged.  Salt intake issues were discussed. ?Mixed dyslipidemia: Doing well with diet.  Followed by primary care.  Diet emphasized. ?Renal insufficiency: I discussed this with him at length.  His creatinine is abnormal.  He sees a nephrologist.  I told him medications to avoid such as NSAIDs and medication such as Indocin.  He understands and promises to comply and discussed this with his internist and primary care. ?Patient will be seen in follow-up appointment in 12 months or earlier if the patient has any concerns ? ? ? ?Medication Adjustments/Labs and Tests Ordered: ?Current medicines are reviewed at length with the patient today.  Concerns regarding medicines are outlined above.  ?No orders of the defined types were placed in this encounter. ? ?Meds ordered this encounter  ?Medications  ? nitroGLYCERIN (NITROSTAT) 0.4 MG SL tablet  ?  Sig: Place 1 tablet (0.4 mg total) under the tongue every 5 (five) minutes as needed for chest pain.  ?  Dispense:  25 tablet  ?  Refill:  6  ? metoprolol succinate (TOPROL-XL) 25 MG 24 hr tablet  ?  Sig: Take 1 tablet (25 mg total) by mouth daily.  ?  Dispense:  90 tablet  ?  Refill:  3  ? ? ? ?No chief complaint on file. ?  ? ?History of Present Illness:   ? ?Tyler Santana is a 61 y.o. male.  Patient has  past medical history of essential hypertension, mixed dyslipidemia and renal insufficiency.  He denies any problems at this time and takes care of activities of daily living.  No chest pain orthopnea or PND.  At the time of my evaluation, the patient is alert awake oriented and in no distress. ? ?Past Medical History:  ?Diagnosis Date  ? Cardiac murmur 08/19/2021  ? Chest pain 06/26/2020  ? Chest pain of uncertain etiology [id number]  ? Dyspnea 06/26/2020  ? Encounter for Department of Transportation (DOT) examination for driving license renewal [id number]  ? Essential hypertension   ? Forearm contusion 06/12/2014  ? Head contusion 06/12/2014  ? Laceration of forehead without complication [id number]  ? Mixed hyperlipidemia   ? Palpitation   ? Prediabetes   ? Right inguinal hernia 07/13/2014  ? ? ?Past Surgical History:  ?Procedure Laterality Date  ? CLEFT PALATE REPAIR  1966  ? [place], [id number]  ? both eyes  ? TYMPANOSTOMY TUBE PLACEMENT    ? ? ?Current Medications: ?Current Meds  ?Medication Sig  ? indomethacin (INDOCIN) 50 MG capsule Take 50 mg by mouth 2 (two) times daily.  ? tamsulosin (FLOMAX) 0.4 MG CAPS capsule Take 0.4 mg by mouth at bedtime.  ? [DISCONTINUED] metoprolol succinate (TOPROL-XL) 25 MG 24 hr tablet Take 25 mg by mouth daily.  ? [DISCONTINUED] nitroGLYCERIN (  NITROSTAT) 0.4 MG SL tablet Place 0.4 mg under the tongue every 5 (five) minutes as needed for chest pain.  ?  ? ?Allergies:   Codeine  ? ?Social History  ? ?Socioeconomic History  ? Marital status: Widowed  ?  Spouse name: Not on file  ? Number of children: Not on file  ? Years of education: Not on file  ? Highest education level: Not on file  ?Occupational History  ? Not on file  ?Tobacco Use  ? Smoking status: Never  ? Smokeless tobacco: Never  ?Substance and Sexual Activity  ? Alcohol use: Not on file  ? Drug use: Not on file  ? Sexual activity: Not on file  ?Other Topics Concern  ? Not on file  ?Social History Narrative  ? Not on file   ? ?Social Determinants of Health  ? ?Financial Resource Strain: Not on file  ?Food Insecurity: Not on file  ?Transportation Needs: Not on file  ?Physical Activity: Not on file  ?Stress: Not on file  ?Social Connections: Not on file  ?  ? ?Family History: ?The patient's family history includes Cancer in his mother; Diabetes in his maternal grandmother; Heart attack in his mother; Heart disease in his mother; Hypertension in his mother. ? ?ROS:   ?Please see the history of present illness.    ?All other systems reviewed and are negative. ? ?EKGs/Labs/Other Studies Reviewed:   ? ?The following studies were reviewed today: ?EKG reveals sinus rhythm and nonspecific ST-T changes ? ? ?Recent Labs: ?No results found for requested labs within last 8760 hours.  ?Recent Lipid Panel ?No results found for: CHOL, TRIG, HDL, CHOLHDL, VLDL, LDLCALC, LDLDIRECT ? ?Physical Exam:   ? ?VS:  BP 120/68   Pulse 76   Ht 5\' 5"  (1.651 m)   Wt 149 lb 1.9 oz (67.6 kg)   SpO2 95%   BMI 24.81 kg/m?    ? ?Wt Readings from Last 3 Encounters:  ?01/26/22 149 lb 1.9 oz (67.6 kg)  ?08/26/21 148 lb (67.1 kg)  ?08/19/21 148 lb 1.9 oz (67.2 kg)  ?  ? ?GEN: Patient is in no acute distress ?HEENT: Normal ?NECK: No JVD; No carotid bruits ?LYMPHATICS: No lymphadenopathy ?CARDIAC: Hear sounds regular, 2/6 systolic murmur at the apex. ?RESPIRATORY:  Clear to auscultation without rales, wheezing or rhonchi  ?ABDOMEN: Soft, non-tender, non-distended ?MUSCULOSKELETAL:  No edema; No deformity  ?SKIN: Warm and dry ?NEUROLOGIC:  Alert and oriented x 3 ?PSYCHIATRIC:  Normal affect  ? ?Signed, ?Jenean Lindau, MD  ?01/26/2022 2:20 PM    ?[place]  ?

## 2022-06-18 ENCOUNTER — Telehealth: Payer: Self-pay | Admitting: Cardiology

## 2022-06-18 DIAGNOSIS — N289 Disorder of kidney and ureter, unspecified: Secondary | ICD-10-CM

## 2022-06-18 NOTE — Telephone Encounter (Signed)
Pt fiance called stating that Dr. Tomie China wanted him to go to a nephrologist and they found one that accepts their insurance. They are requesting a referral be faxed over there.   [facility] - Nephrology  Fax number: [phone]

## 2022-06-23 NOTE — Telephone Encounter (Signed)
Referral has been sent.

## 2022-07-07 ENCOUNTER — Other Ambulatory Visit: Payer: Self-pay

## 2022-07-07 ENCOUNTER — Telehealth: Payer: Self-pay

## 2022-07-07 ENCOUNTER — Telehealth: Payer: Self-pay | Admitting: Cardiology

## 2022-07-07 NOTE — Telephone Encounter (Signed)
Patient fiance called and said that she needs patients paperwork faxed to the NEPHROLOGY dept.

## 2022-07-07 NOTE — Telephone Encounter (Signed)
Received a call from the patient asking for his records to be faxed to his nephrologist. I asked what forms they needed me to fax to the nephrologist and they said the last office note. The patient's last office note was faxed to the number provided. The patient was appreciative and had no further questions at this time.

## 2022-07-09 NOTE — Telephone Encounter (Signed)
Spoke with patient to reschedule appointment. I let him know that we did not have an appointment scheduled in our office for him. He stated he does not know what is going on and will have his girlfriend call the office.

## 2022-07-13 DIAGNOSIS — E782 Mixed hyperlipidemia: Secondary | ICD-10-CM | POA: Diagnosis not present

## 2022-07-13 DIAGNOSIS — R42 Dizziness and giddiness: Secondary | ICD-10-CM | POA: Diagnosis not present

## 2022-07-13 DIAGNOSIS — R69 Illness, unspecified: Secondary | ICD-10-CM | POA: Diagnosis not present

## 2022-08-04 DIAGNOSIS — M545 Low back pain, unspecified: Secondary | ICD-10-CM | POA: Diagnosis not present

## 2022-08-24 DIAGNOSIS — E559 Vitamin D deficiency, unspecified: Secondary | ICD-10-CM | POA: Diagnosis not present

## 2022-08-24 DIAGNOSIS — N210 Calculus in bladder: Secondary | ICD-10-CM | POA: Diagnosis not present

## 2022-08-24 DIAGNOSIS — Z87448 Personal history of other diseases of urinary system: Secondary | ICD-10-CM | POA: Diagnosis not present

## 2022-08-24 DIAGNOSIS — Z79899 Other long term (current) drug therapy: Secondary | ICD-10-CM | POA: Diagnosis not present

## 2022-08-24 DIAGNOSIS — N200 Calculus of kidney: Secondary | ICD-10-CM | POA: Diagnosis not present

## 2022-08-24 DIAGNOSIS — N182 Chronic kidney disease, stage 2 (mild): Secondary | ICD-10-CM | POA: Diagnosis not present

## 2022-08-24 DIAGNOSIS — E785 Hyperlipidemia, unspecified: Secondary | ICD-10-CM | POA: Diagnosis not present

## 2022-08-24 DIAGNOSIS — I129 Hypertensive chronic kidney disease with stage 1 through stage 4 chronic kidney disease, or unspecified chronic kidney disease: Secondary | ICD-10-CM | POA: Diagnosis not present

## 2022-08-25 DIAGNOSIS — N182 Chronic kidney disease, stage 2 (mild): Secondary | ICD-10-CM

## 2022-08-25 HISTORY — DX: Chronic kidney disease, stage 2 (mild): N18.2

## 2022-09-21 DIAGNOSIS — N182 Chronic kidney disease, stage 2 (mild): Secondary | ICD-10-CM | POA: Diagnosis not present

## 2023-01-21 ENCOUNTER — Ambulatory Visit: Payer: 59 | Admitting: Cardiology

## 2023-02-26 ENCOUNTER — Other Ambulatory Visit: Payer: Self-pay | Admitting: Cardiology

## 2023-02-26 NOTE — Telephone Encounter (Signed)
Refill sent to pharmacy.   

## 2023-04-13 ENCOUNTER — Ambulatory Visit: Payer: 59 | Attending: Cardiology | Admitting: Cardiology

## 2023-04-13 ENCOUNTER — Encounter: Payer: Self-pay | Admitting: Cardiology

## 2023-04-13 VITALS — BP 108/78 | HR 74 | Ht 65.0 in | Wt 142.0 lb

## 2023-04-13 DIAGNOSIS — R079 Chest pain, unspecified: Secondary | ICD-10-CM | POA: Diagnosis not present

## 2023-04-13 DIAGNOSIS — I10 Essential (primary) hypertension: Secondary | ICD-10-CM

## 2023-04-13 DIAGNOSIS — E782 Mixed hyperlipidemia: Secondary | ICD-10-CM | POA: Diagnosis not present

## 2023-04-13 NOTE — Progress Notes (Signed)
Cardiology Office Note:    Date:  04/13/2023   ID:  Tyler Santana, DOB [birth year], MRN [id number]  PCP:  Jackie Plum, MD  Cardiologist:  Garwin Brothers, MD   Referring MD: Jackie Plum, MD    ASSESSMENT:    1. Chest pain of uncertain etiology   2. Essential hypertension   3. Mixed hyperlipidemia    PLAN:    In order of problems listed above:  Primary prevention stressed with the patient.  Importance of compliance with diet medication stressed and patient verbalized standing. Hypertension: His blood pressure is stable.  His wife mentions to me that he needs the dose of beta-blocker otherwise his blood pressure goes up. Mixed dyslipidemia: Diet was emphasized.  Lipids reviewed he is doing well with this.  He has history of renal insufficiency but his last renal function appears to be within normal limits.  This is managed by primary care. Patient will be seen in follow-up appointment in 6 months or earlier if the patient has any concerns.    Medication Adjustments/Labs and Tests Ordered: Current medicines are reviewed at length with the patient today.  Concerns regarding medicines are outlined above.  Orders Placed This Encounter  Procedures   EKG 12-Lead   No orders of the defined types were placed in this encounter.    No chief complaint on file.    History of Present Illness:    Tyler Santana is a 62 y.o. male.  Patient has past medical history of essential hypertension and mixed dyslipidemia.  He denies any problems at this time and takes care of activities of daily living.  No chest pain orthopnea or PND.  He takes care of activities of daily at the time of my evaluation, the patient is alert awake oriented and in no distress.  Past Medical History:  Diagnosis Date   Cardiac murmur 08/19/2021   Chest pain 06/26/2020   Chest pain of uncertain etiology 08/19/2021   Dyspnea 06/26/2020   Encounter for Department of Transportation (DOT) examination for  driving license renewal 40/98/1191   Essential hypertension    Forearm contusion 06/12/2014   Head contusion 06/12/2014   Laceration of forehead without complication 06/12/2014   Mixed hyperlipidemia    Palpitation    Prediabetes    Renal insufficiency 01/26/2022   Right inguinal hernia 07/13/2014   Stage 2 chronic kidney disease 08/25/2022   Unilateral inguinal hernia without obstruction or gangrene 09/21/2014    Past Surgical History:  Procedure Laterality Date   CLEFT PALATE REPAIR  1966   EYE SURGERY  1967, 4782,9562   both eyes   TYMPANOSTOMY TUBE PLACEMENT      Current Medications: Current Meds  Medication Sig   metoprolol succinate (TOPROL-XL) 25 MG 24 hr tablet Take 1 tablet by mouth once daily   nitroGLYCERIN (NITROSTAT) 0.4 MG SL tablet Place 1 tablet (0.4 mg total) under the tongue every 5 (five) minutes as needed for chest pain.     Allergies:   Codeine   Social History   Socioeconomic History   Marital status: Widowed    Spouse name: Not on file   Number of children: Not on file   Years of education: Not on file   Highest education level: Not on file  Occupational History   Not on file  Tobacco Use   Smoking status: Never   Smokeless tobacco: Never  Substance and Sexual Activity   Alcohol use: Not on file   Drug use: Not on file  Sexual activity: Not on file  Other Topics Concern   Not on file  Social History Narrative   Not on file   Social Determinants of Health   Financial Resource Strain: Not on file  Food Insecurity: Not on file  Transportation Needs: Not on file  Physical Activity: Not on file  Stress: Not on file  Social Connections: Not on file     Family History: The patient's family history includes Cancer in his mother; Diabetes in his maternal grandmother; Heart attack in his mother; Heart disease in his mother; Hypertension in his mother.  ROS:   Please see the history of present illness.    All other systems reviewed and  are negative.  EKGs/Labs/Other Studies Reviewed:    The following studies were reviewed today: EKG revealed sinus rhythm with nonspecific ST-T changes   Recent Labs: No results found for requested labs within last 365 days.  Recent Lipid Panel No results found for: "CHOL", "TRIG", "HDL", "CHOLHDL", "VLDL", "LDLCALC", "LDLDIRECT"  Physical Exam:    VS:  BP 108/78   Pulse 74   Ht 5\' 5"  (1.651 m)   Wt 142 lb (64.4 kg)   SpO2 95%   BMI 23.63 kg/m     Wt Readings from Last 3 Encounters:  04/13/23 142 lb (64.4 kg)  01/26/22 149 lb 1.9 oz (67.6 kg)  08/26/21 148 lb (67.1 kg)     GEN: Patient is in no acute distress HEENT: Normal NECK: No JVD; No carotid bruits LYMPHATICS: No lymphadenopathy CARDIAC: Hear sounds regular, 2/6 systolic murmur at the apex. RESPIRATORY:  Clear to auscultation without rales, wheezing or rhonchi  ABDOMEN: Soft, non-tender, non-distended MUSCULOSKELETAL:  No edema; No deformity  SKIN: Warm and dry NEUROLOGIC:  Alert and oriented x 3 PSYCHIATRIC:  Normal affect   Signed, Garwin Brothers, MD  04/13/2023 4:45 PM    [facility]

## 2023-04-13 NOTE — Patient Instructions (Signed)
Medication Instructions:  Your physician recommends that you continue on your current medications as directed. Please refer to the Current Medication list given to you today.  *If you need a refill on your cardiac medications before your next appointment, please call your pharmacy*   Lab Work: None ordered If you have labs (blood work) drawn today and your tests are completely normal, you will receive your results only by: MyChart Message (if you have MyChart) OR A paper copy in the mail If you have any lab test that is abnormal or we need to change your treatment, we will call you to review the results.   Testing/Procedures: None ordered   Follow-Up: At [facility], you and your health needs are our priority.  As part of our continuing mission to provide you with exceptional heart care, we have created designated Provider Care Teams.  These Care Teams include your primary Cardiologist (physician) and Advanced Practice Providers (APPs -  Physician Assistants and Nurse Practitioners) who all work together to provide you with the care you need, when you need it.  We recommend signing up for the patient portal called "MyChart".  Sign up information is provided on this After Visit Summary.  MyChart is used to connect with patients for Virtual Visits (Telemedicine).  Patients are able to view lab/test results, encounter notes, upcoming appointments, etc.  Non-urgent messages can be sent to your provider as well.   To learn more about what you can do with MyChart, go to [email or web address].com.    Your next appointment:   12 month(s)  The format for your next appointment:   In Person  Provider:   Rajan Revankar, MD    Other Instructions none  Important Information About Sugar      

## 2023-05-10 ENCOUNTER — Other Ambulatory Visit: Payer: Self-pay | Admitting: Cardiology

## 2023-08-06 DIAGNOSIS — M79642 Pain in left hand: Secondary | ICD-10-CM | POA: Diagnosis not present

## 2023-08-06 DIAGNOSIS — M47812 Spondylosis without myelopathy or radiculopathy, cervical region: Secondary | ICD-10-CM | POA: Diagnosis not present

## 2023-08-06 DIAGNOSIS — M5021 Other cervical disc displacement,  high cervical region: Secondary | ICD-10-CM | POA: Diagnosis not present

## 2023-08-06 DIAGNOSIS — R079 Chest pain, unspecified: Secondary | ICD-10-CM | POA: Diagnosis not present

## 2023-08-06 DIAGNOSIS — E785 Hyperlipidemia, unspecified: Secondary | ICD-10-CM | POA: Diagnosis not present

## 2023-08-06 DIAGNOSIS — M4804 Spinal stenosis, thoracic region: Secondary | ICD-10-CM | POA: Diagnosis not present

## 2023-08-06 DIAGNOSIS — R2681 Unsteadiness on feet: Secondary | ICD-10-CM | POA: Diagnosis not present

## 2023-08-06 DIAGNOSIS — I129 Hypertensive chronic kidney disease with stage 1 through stage 4 chronic kidney disease, or unspecified chronic kidney disease: Secondary | ICD-10-CM | POA: Diagnosis not present

## 2023-08-06 DIAGNOSIS — R5383 Other fatigue: Secondary | ICD-10-CM | POA: Diagnosis not present

## 2023-08-06 DIAGNOSIS — R252 Cramp and spasm: Secondary | ICD-10-CM | POA: Diagnosis not present

## 2023-08-06 DIAGNOSIS — E782 Mixed hyperlipidemia: Secondary | ICD-10-CM | POA: Diagnosis not present

## 2023-08-06 DIAGNOSIS — R531 Weakness: Secondary | ICD-10-CM | POA: Diagnosis not present

## 2023-08-06 DIAGNOSIS — M542 Cervicalgia: Secondary | ICD-10-CM | POA: Diagnosis not present

## 2023-08-06 DIAGNOSIS — R7303 Prediabetes: Secondary | ICD-10-CM | POA: Diagnosis not present

## 2023-08-06 DIAGNOSIS — R29898 Other symptoms and signs involving the musculoskeletal system: Secondary | ICD-10-CM | POA: Diagnosis not present

## 2023-08-06 DIAGNOSIS — R55 Syncope and collapse: Secondary | ICD-10-CM | POA: Diagnosis not present

## 2023-08-06 DIAGNOSIS — E860 Dehydration: Secondary | ICD-10-CM | POA: Diagnosis not present

## 2023-08-06 DIAGNOSIS — M4802 Spinal stenosis, cervical region: Secondary | ICD-10-CM | POA: Diagnosis not present

## 2023-08-06 DIAGNOSIS — G8194 Hemiplegia, unspecified affecting left nondominant side: Secondary | ICD-10-CM | POA: Diagnosis not present

## 2023-08-06 DIAGNOSIS — R200 Anesthesia of skin: Secondary | ICD-10-CM | POA: Diagnosis not present

## 2023-08-06 DIAGNOSIS — R0600 Dyspnea, unspecified: Secondary | ICD-10-CM | POA: Diagnosis not present

## 2023-08-06 DIAGNOSIS — N182 Chronic kidney disease, stage 2 (mild): Secondary | ICD-10-CM | POA: Diagnosis not present

## 2023-08-10 DIAGNOSIS — E782 Mixed hyperlipidemia: Secondary | ICD-10-CM | POA: Diagnosis not present

## 2023-08-10 DIAGNOSIS — Z604 Social exclusion and rejection: Secondary | ICD-10-CM | POA: Diagnosis not present

## 2023-08-10 DIAGNOSIS — F32A Depression, unspecified: Secondary | ICD-10-CM | POA: Diagnosis not present

## 2023-08-10 DIAGNOSIS — N182 Chronic kidney disease, stage 2 (mild): Secondary | ICD-10-CM | POA: Diagnosis not present

## 2023-08-10 DIAGNOSIS — I129 Hypertensive chronic kidney disease with stage 1 through stage 4 chronic kidney disease, or unspecified chronic kidney disease: Secondary | ICD-10-CM | POA: Diagnosis not present

## 2023-08-10 DIAGNOSIS — Z7982 Long term (current) use of aspirin: Secondary | ICD-10-CM | POA: Diagnosis not present

## 2023-08-10 DIAGNOSIS — R7303 Prediabetes: Secondary | ICD-10-CM | POA: Diagnosis not present

## 2023-08-10 DIAGNOSIS — F419 Anxiety disorder, unspecified: Secondary | ICD-10-CM | POA: Diagnosis not present

## 2023-08-10 DIAGNOSIS — G8194 Hemiplegia, unspecified affecting left nondominant side: Secondary | ICD-10-CM | POA: Diagnosis not present

## 2023-08-23 DIAGNOSIS — M4802 Spinal stenosis, cervical region: Secondary | ICD-10-CM | POA: Diagnosis not present

## 2023-08-23 DIAGNOSIS — M25512 Pain in left shoulder: Secondary | ICD-10-CM | POA: Diagnosis not present

## 2023-08-23 DIAGNOSIS — M79602 Pain in left arm: Secondary | ICD-10-CM | POA: Diagnosis not present

## 2023-08-23 DIAGNOSIS — M5031 Other cervical disc degeneration,  high cervical region: Secondary | ICD-10-CM | POA: Diagnosis not present

## 2023-08-23 DIAGNOSIS — G8929 Other chronic pain: Secondary | ICD-10-CM | POA: Diagnosis not present

## 2023-08-23 DIAGNOSIS — M542 Cervicalgia: Secondary | ICD-10-CM | POA: Diagnosis not present

## 2023-08-30 DIAGNOSIS — N182 Chronic kidney disease, stage 2 (mild): Secondary | ICD-10-CM | POA: Diagnosis not present

## 2023-09-03 DIAGNOSIS — M5410 Radiculopathy, site unspecified: Secondary | ICD-10-CM | POA: Diagnosis not present

## 2023-09-03 DIAGNOSIS — M542 Cervicalgia: Secondary | ICD-10-CM | POA: Diagnosis not present

## 2023-09-06 DIAGNOSIS — N182 Chronic kidney disease, stage 2 (mild): Secondary | ICD-10-CM | POA: Diagnosis not present

## 2023-09-06 DIAGNOSIS — I129 Hypertensive chronic kidney disease with stage 1 through stage 4 chronic kidney disease, or unspecified chronic kidney disease: Secondary | ICD-10-CM | POA: Diagnosis not present

## 2023-09-06 DIAGNOSIS — G8929 Other chronic pain: Secondary | ICD-10-CM | POA: Diagnosis not present

## 2023-09-06 DIAGNOSIS — M542 Cervicalgia: Secondary | ICD-10-CM | POA: Diagnosis not present

## 2023-09-06 DIAGNOSIS — E782 Mixed hyperlipidemia: Secondary | ICD-10-CM | POA: Diagnosis not present

## 2023-09-06 DIAGNOSIS — Z09 Encounter for follow-up examination after completed treatment for conditions other than malignant neoplasm: Secondary | ICD-10-CM | POA: Diagnosis not present

## 2023-09-06 DIAGNOSIS — R531 Weakness: Secondary | ICD-10-CM | POA: Diagnosis not present

## 2023-09-06 DIAGNOSIS — R2681 Unsteadiness on feet: Secondary | ICD-10-CM | POA: Diagnosis not present

## 2023-09-06 DIAGNOSIS — M6281 Muscle weakness (generalized): Secondary | ICD-10-CM | POA: Diagnosis not present

## 2023-09-06 DIAGNOSIS — M25512 Pain in left shoulder: Secondary | ICD-10-CM | POA: Diagnosis not present

## 2023-09-15 DIAGNOSIS — M25511 Pain in right shoulder: Secondary | ICD-10-CM | POA: Diagnosis not present

## 2023-09-15 DIAGNOSIS — M47812 Spondylosis without myelopathy or radiculopathy, cervical region: Secondary | ICD-10-CM | POA: Diagnosis not present

## 2023-09-15 DIAGNOSIS — M25512 Pain in left shoulder: Secondary | ICD-10-CM | POA: Diagnosis not present

## 2023-09-15 DIAGNOSIS — M5412 Radiculopathy, cervical region: Secondary | ICD-10-CM | POA: Diagnosis not present

## 2023-09-15 DIAGNOSIS — M542 Cervicalgia: Secondary | ICD-10-CM | POA: Diagnosis not present

## 2023-10-07 DIAGNOSIS — M542 Cervicalgia: Secondary | ICD-10-CM | POA: Diagnosis not present

## 2023-10-07 DIAGNOSIS — M5412 Radiculopathy, cervical region: Secondary | ICD-10-CM | POA: Diagnosis not present

## 2023-10-24 DIAGNOSIS — E785 Hyperlipidemia, unspecified: Secondary | ICD-10-CM | POA: Diagnosis not present

## 2023-10-24 DIAGNOSIS — G8194 Hemiplegia, unspecified affecting left nondominant side: Secondary | ICD-10-CM | POA: Diagnosis not present

## 2023-10-24 DIAGNOSIS — N3941 Urge incontinence: Secondary | ICD-10-CM | POA: Diagnosis not present

## 2023-10-24 DIAGNOSIS — E559 Vitamin D deficiency, unspecified: Secondary | ICD-10-CM | POA: Diagnosis not present

## 2023-10-24 DIAGNOSIS — N400 Enlarged prostate without lower urinary tract symptoms: Secondary | ICD-10-CM | POA: Diagnosis not present

## 2023-10-24 DIAGNOSIS — I10 Essential (primary) hypertension: Secondary | ICD-10-CM | POA: Diagnosis not present

## 2023-10-24 DIAGNOSIS — M62838 Other muscle spasm: Secondary | ICD-10-CM | POA: Diagnosis not present

## 2023-10-24 DIAGNOSIS — M4800 Spinal stenosis, site unspecified: Secondary | ICD-10-CM | POA: Diagnosis not present

## 2023-10-24 DIAGNOSIS — F32A Depression, unspecified: Secondary | ICD-10-CM | POA: Diagnosis not present

## 2023-10-24 DIAGNOSIS — Z7982 Long term (current) use of aspirin: Secondary | ICD-10-CM | POA: Diagnosis not present

## 2023-10-24 DIAGNOSIS — F419 Anxiety disorder, unspecified: Secondary | ICD-10-CM | POA: Diagnosis not present

## 2023-11-15 DIAGNOSIS — M5412 Radiculopathy, cervical region: Secondary | ICD-10-CM | POA: Diagnosis not present

## 2023-12-02 ENCOUNTER — Other Ambulatory Visit: Payer: Self-pay | Admitting: Cardiology

## 2024-03-14 ENCOUNTER — Encounter: Payer: Self-pay | Admitting: Cardiology

## 2024-03-14 NOTE — Telephone Encounter (Signed)
 Error

## 2024-03-16 ENCOUNTER — Telehealth: Payer: Self-pay | Admitting: Cardiology

## 2024-03-16 NOTE — Telephone Encounter (Signed)
   Name: Tyler Santana  DOB: June 23, 1961  MRN: [id number]  Primary Cardiologist: None  Chart reviewed as part of pre-operative protocol coverage. The patient has an upcoming visit scheduled with Dr. Lafayette Pierre on 03/22/2024 at which time clearance can be addressed in case there are any issues that would impact surgical recommendations.  Right Arthroscopy shoulder surgery, rotator cuff repair is not scheduled until 03/28/2024 as below. I added preop FYI to appointment note so that provider is aware to address at time of outpatient visit.  Per office protocol the cardiology provider should forward their finalized clearance decision and recommendations regarding antiplatelet therapy to the requesting party below.     I will route this message as FYI to requesting party and remove this message from the preop box as separate preop APP input not needed at this time.   Please call with any questions.  Jude Norton, NP  03/16/2024, 1:50 PM

## 2024-03-16 NOTE — Telephone Encounter (Signed)
   Pre-operative Risk Assessment    Patient Name: Tyler Santana  DOB: Jan 28, 1961 MRN: [id number]      Request for Surgical Clearance    Procedure:  Right Arthroscopy shoulder surgery, rotator cuff repair   Date of Surgery:  Clearance 03/28/24                                 Surgeon: Dr. [place] Case Surgeon's Group or Practice Name: [facility] number: [phone] Fax number: [phone] or Email: [email or web address] (Due to fax no longer going to them directly she stated and she's afraid they won't receive it)   Type of Clearance Requested:   - Medical  - Pharmacy:  Hold        Type of Anesthesia:  General    Additional requests/questions:  Please advise surgeon/provider what medications should be held.  Tyler Santana   03/16/2024, 1:38 PM

## 2024-03-21 DIAGNOSIS — Z01810 Encounter for preprocedural cardiovascular examination: Secondary | ICD-10-CM | POA: Insufficient documentation

## 2024-03-22 ENCOUNTER — Encounter: Payer: Self-pay | Admitting: Cardiology

## 2024-03-22 ENCOUNTER — Ambulatory Visit: Payer: Self-pay | Attending: Cardiology | Admitting: Cardiology

## 2024-03-22 VITALS — BP 118/74 | HR 77 | Ht 65.0 in | Wt 151.8 lb

## 2024-03-22 DIAGNOSIS — Z01810 Encounter for preprocedural cardiovascular examination: Secondary | ICD-10-CM

## 2024-03-22 DIAGNOSIS — E782 Mixed hyperlipidemia: Secondary | ICD-10-CM

## 2024-03-22 DIAGNOSIS — I10 Essential (primary) hypertension: Secondary | ICD-10-CM | POA: Diagnosis not present

## 2024-03-22 DIAGNOSIS — Z01818 Encounter for other preprocedural examination: Secondary | ICD-10-CM

## 2024-03-22 NOTE — Patient Instructions (Signed)
 Medication Instructions:  Your physician recommends that you continue on your current medications as directed. Please refer to the Current Medication list given to you today.  *If you need a refill on your cardiac medications before your next appointment, please call your pharmacy*  Testing/Procedures: Your physician has requested that you have a lexiscan  myoview . For further information please visit [email or web address] Please follow instruction sheet, as given.  Follow-Up: At [facility], you and your health needs are our priority.  As part of our continuing mission to provide you with exceptional heart care, our providers are all part of one team.  This team includes your primary Cardiologist (physician) and Advanced Practice Providers or APPs (Physician Assistants and Nurse Practitioners) who all work together to provide you with the care you need, when you need it.  Your next appointment:   12 month(s)  Provider:   Hillis Lu, MD  We recommend signing up for the patient portal called "MyChart".  Sign up information is provided on this After Visit Summary.  MyChart is used to connect with patients for Virtual Visits (Telemedicine).  Patients are able to view lab/test results, encounter notes, upcoming appointments, etc.  Non-urgent messages can be sent to your provider as well.   To learn more about what you can do with MyChart, go to [email or web address].   Other Instructions Lexiscan  Myoview  (Stress Test) Instructions  Please arrive 15 minutes prior to your appointment time for registration and insurance purposes.   The test will take approximately 3 to 4 hours to complete; you may bring reading material.  If someone comes with you to your appointment, they will need to remain in the main lobby due to limited space in the testing area. **If you are pregnant or breastfeeding, please notify the nuclear lab prior to your appointment**   How to prepare for your  Myocardial Perfusion Test: Do not eat or drink 3 hours prior to your test, except you may have water. Do not consume products containing caffeine (regular or decaffeinated) 12 hours prior to your test. (ex: coffee, chocolate, sodas, tea). Do bring a list of your current medications with you.  If not listed below, you may take your medications as normal. Do wear comfortable clothes (no dresses or overalls) and walking shoes, tennis shoes preferred (No heels or open toe shoes are allowed). Do NOT wear cologne, perfume, aftershave, or lotions (deodorant is allowed). If these instructions are not followed, your test will have to be rescheduled.   If you have questions or concerns about your appointment, you can call the Nuclear Lab at [phone].   If you cannot keep your appointment, please provide 24 hours notification to the Nuclear Lab, to avoid a possible $50 charge to your account.

## 2024-03-22 NOTE — Addendum Note (Signed)
 Addended by: Luwana Salvo on: 03/22/2024 03:24 PM   Modules accepted: Orders

## 2024-03-22 NOTE — Progress Notes (Signed)
 Cardiology Office Note:    Date:  03/22/2024   ID:  Tyler Santana, DOB 1961-10-06, MRN [id number]  PCP:  Tyler Fu, MD  Cardiologist:  Tyler Balzarine, MD   Referring MD: Tyler Fu, MD    ASSESSMENT:    1. Preop examination   2. Essential hypertension   3. Mixed hyperlipidemia    PLAN:    In order of problems listed above:  Primary prevention stressed with the patient.  Importance of compliance with diet medication stressed and patient verbalized standing. Preoperative cardiovascular risk assessment: I discussed my findings with the patient at length.  He has multiple risk factors for coronary artery disease and leads a sedentary lifestyle.  I discussed Lexiscan  sestamibi and he is agreeable.  This test will help us  further medical evaluation.  If is negative then he is not at high risk for coronary events during the aforementioned surgery.  Meticulous hemodynamic monitoring will further reduce the risk of coronary events. Mixed dyslipidemia: On lipid-lowering medications followed by primary care. Essential hypertension: Blood pressure is stable and diet was emphasized. Patient will be seen in follow-up appointment in 9 months or earlier if the patient has any concerns.    Medication Adjustments/Labs and Tests Ordered: Current medicines are reviewed at length with the patient today.  Concerns regarding medicines are outlined above.  Orders Placed This Encounter  Procedures   EKG 12-Lead   No orders of the defined types were placed in this encounter.    No chief complaint on file.    History of Present Illness:    Tyler Santana is a 63 y.o. male.  Patient has past medical history of mixed dyslipidemia and essential hypertension.  He is planning to undergo shoulder surgery in a week's time or so.  He leads a sedentary lifestyle.  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  This is a  planned shoulder surgery.  No chest pain orthopnea or PND.  He has risk factors for coronary artery disease as mentioned above.  He is here for risk stratification.  At the time of my evaluation, the patient is alert awake oriented and in no distress.  Past Medical History:  Diagnosis Date   Cardiac murmur 08/19/2021   Chest pain 06/26/2020   Chest pain of uncertain etiology 08/19/2021   Dyspnea 06/26/2020   Encounter for Department of Transportation (DOT) examination for driving license renewal 95/62/1308   Essential hypertension    Forearm contusion 06/12/2014   Head contusion 06/12/2014   Laceration of forehead without complication 06/12/2014   Mixed hyperlipidemia    Palpitation    Prediabetes    Preop cardiovascular exam    Renal insufficiency 01/26/2022   Right inguinal hernia 07/13/2014   Stage 2 chronic kidney disease 08/25/2022   Unilateral inguinal hernia without obstruction or gangrene 09/21/2014    Past Surgical History:  Procedure Laterality Date   CLEFT PALATE REPAIR  1966   EYE SURGERY  1967, 6578,4696   both eyes   TYMPANOSTOMY TUBE PLACEMENT      Current Medications: Current Meds  Medication Sig   aspirin EC 81 MG tablet Take 81 mg by mouth daily. Swallow whole.   atorvastatin (LIPITOR) 40 MG tablet Take 40 mg by mouth daily.   baclofen (LIORESAL) 10 MG tablet Take 1 tablet by mouth 3 (three) times daily as needed.   metoprolol  succinate (TOPROL -XL) 25 MG 24 hr tablet Take  1 tablet by mouth once daily   nitroGLYCERIN  (NITROSTAT ) 0.4 MG SL tablet Place 1 tablet (0.4 mg total) under the tongue every 5 (five) minutes as needed for chest pain.     Allergies:   Codeine   Social History   Socioeconomic History   Marital status: Widowed    Spouse name: Not on file   Number of children: Not on file   Years of education: Not on file   Highest education level: Not on file  Occupational History   Not on file  Tobacco Use   Smoking status: Never   Smokeless  tobacco: Never  Substance and Sexual Activity   Alcohol use: Not on file   Drug use: Not on file   Sexual activity: Not on file  Other Topics Concern   Not on file  Social History Narrative   Not on file   Social Drivers of Health   Financial Resource Strain: Not on file  Food Insecurity: Low Risk  (08/30/2023)   Received from [facility]   Hunger Vital Sign    Worried About Running Out of Food in the Last Year: Never true    Ran Out of Food in the Last Year: Never true  Transportation Needs: No Transportation Needs (08/30/2023)   Received from [facility]    In the past 12 months, has lack of reliable transportation kept you from medical appointments, meetings, work or from getting things needed for daily living? : No  Physical Activity: Not on file  Stress: Not on file  Social Connections: Unknown (02/20/2022)   Received from [facility]   Social Network    Social Network: Not on file     Family History: The patient's family history includes Cancer in his mother; Diabetes in his maternal grandmother; Heart attack in his mother; Heart disease in his mother; Hypertension in his mother.  ROS:   Please see the history of present illness.    All other systems reviewed and are negative.  EKGs/Labs/Other Studies Reviewed:    The following studies were reviewed today: .Tyler AasEKG Interpretation Date/Time:  Wednesday March 22 2024 14:11:54 EDT Ventricular Rate:  77 PR Interval:  166 QRS Duration:  80 QT Interval:  376 QTC Calculation: 425 R Axis:   6  Text Interpretation: Normal sinus rhythm Normal ECG No previous ECGs available Confirmed by Tyler Santana [phone]) on 03/22/2024 2:23:06 PM     Recent Labs: No results found for requested labs within last 365 days.  Recent Lipid Panel No results found for: "CHOL", "TRIG", "HDL", "CHOLHDL", "VLDL", "LDLCALC", "LDLDIRECT"  Physical Exam:    VS:  BP 118/74   Pulse 77   Ht 5\' 5"  (1.651 m)    Wt 151 lb 12.8 oz (68.9 kg)   SpO2 95%   BMI 25.26 kg/m     Wt Readings from Last 3 Encounters:  03/22/24 151 lb 12.8 oz (68.9 kg)  04/13/23 142 lb (64.4 kg)  01/26/22 149 lb 1.9 oz (67.6 kg)     GEN: Patient is in no acute distress HEENT: Normal NECK: No JVD; No carotid bruits LYMPHATICS: No lymphadenopathy CARDIAC: Hear sounds regular, 2/6 systolic murmur at the apex. RESPIRATORY:  Clear to auscultation without rales, wheezing or rhonchi  ABDOMEN: Soft, non-tender, non-distended MUSCULOSKELETAL:  No edema; No deformity  SKIN: Warm and dry NEUROLOGIC:  Alert and oriented x 3 PSYCHIATRIC:  Normal affect   Signed, Tyler Balzarine, MD  03/22/2024 2:34 PM  [facility]

## 2024-03-23 ENCOUNTER — Encounter (HOSPITAL_COMMUNITY)

## 2024-04-04 ENCOUNTER — Telehealth: Payer: Self-pay | Admitting: Cardiology

## 2024-04-04 MED ORDER — NITROGLYCERIN 0.4 MG SL SUBL: 0.4000 mg | SUBLINGUAL_TABLET | SUBLINGUAL | 3 refills | Status: AC | PRN

## 2024-04-04 NOTE — Telephone Encounter (Signed)
*  STAT* If patient is at the pharmacy, call can be transferred to refill team.   1. Which medications need to be refilled? (please list name of each medication and dose if known)   nitroGLYCERIN  (NITROSTAT ) 0.4 MG SL tablet   2. Would you like to learn more about the convenience, safety, & potential cost savings by using the [facility]?   3. Are you open to using the [facility] (Type [facility]. ).  4. Which pharmacy/location (including street and city if local pharmacy) is medication to be sent to?  [facility] - [place], [place] - [address]   5. Do they need a 30 day or 90 day supply?   Caller [employer]) stated patient is completely out of this medication.

## 2024-04-04 NOTE — Telephone Encounter (Signed)
 RX sent to requested Pharmacy

## 2024-06-20 ENCOUNTER — Other Ambulatory Visit: Payer: Self-pay | Admitting: Cardiology

## 2024-09-25 ENCOUNTER — Telehealth: Payer: Self-pay | Admitting: Cardiology

## 2024-09-25 NOTE — Telephone Encounter (Signed)
 Pt c/o of Chest Pain: STAT if active (IN THIS MOMENT) CP, including tightness, pressure, jaw pain, shoulder/upper arm/back pain, SOB, nausea, and vomiting.  1. Are you having CP right now (tightness, pressure, or discomfort)? No   2. Are you experiencing any other symptoms (ex. SOB, nausea, vomiting, sweating)? Has some SOB and dizziness   3. How long have you been experiencing CP?  Once on Friday 12/5   4. Is your CP continuous or coming and going? Coming and going   5. Have you taken Nitroglycerin ? Yes   6. If CP returns before callback, please consider calling 911. ?

## 2024-09-25 NOTE — Telephone Encounter (Signed)
 Spoke with Patty who states that she called the wrong office as the pt sees a new cardiologist due to his insurance.
